# Patient Record
Sex: Female | Born: 1965 | Race: White | Hispanic: No | Smoking: Never smoker
Health system: Southern US, Community
[De-identification: ages and names within clinical notes are randomized; demographics above are authoritative.]

## PROBLEM LIST (undated history)

## (undated) DIAGNOSIS — K5900 Constipation, unspecified: Secondary | ICD-10-CM

## (undated) HISTORY — PX: JOINT REPLACEMENT: SHX530

---

## 2021-04-19 ENCOUNTER — Emergency Department (HOSPITAL_BASED_OUTPATIENT_CLINIC_OR_DEPARTMENT_OTHER)
Admission: EM | Admit: 2021-04-19 | Discharge: 2021-04-19 | Disposition: A | Discharge status: Home / Self Care | Payer: BC Managed Care – PPO | Attending: Emergency Medicine | Admitting: Emergency Medicine

## 2021-04-19 ENCOUNTER — Emergency Department (HOSPITAL_BASED_OUTPATIENT_CLINIC_OR_DEPARTMENT_OTHER): Payer: BC Managed Care – PPO

## 2021-04-19 ENCOUNTER — Encounter (HOSPITAL_BASED_OUTPATIENT_CLINIC_OR_DEPARTMENT_OTHER): Payer: Self-pay | Admitting: *Deleted

## 2021-04-19 ENCOUNTER — Other Ambulatory Visit: Payer: Self-pay

## 2021-04-19 DIAGNOSIS — R1033 Periumbilical pain: Secondary | ICD-10-CM

## 2021-04-19 DIAGNOSIS — N201 Calculus of ureter: Secondary | ICD-10-CM

## 2021-04-19 DIAGNOSIS — K439 Ventral hernia without obstruction or gangrene: Secondary | ICD-10-CM

## 2021-04-19 DIAGNOSIS — R109 Unspecified abdominal pain: Secondary | ICD-10-CM | POA: Diagnosis present

## 2021-04-19 DIAGNOSIS — K654 Sclerosing mesenteritis: Secondary | ICD-10-CM | POA: Diagnosis not present

## 2021-04-19 HISTORY — DX: Constipation, unspecified: K59.00

## 2021-04-19 LAB — CBC WITH DIFFERENTIAL/PLATELET
Abs Immature Granulocytes: 0.03 10*3/uL (ref 0.00–0.07)
Basophils Absolute: 0 10*3/uL (ref 0.0–0.1)
Basophils Relative: 0 %
Eosinophils Absolute: 0 10*3/uL (ref 0.0–0.5)
Eosinophils Relative: 0 %
HCT: 43.7 % (ref 36.0–46.0)
Hemoglobin: 14.5 g/dL (ref 12.0–15.0)
Immature Granulocytes: 0 %
Lymphocytes Relative: 6 %
Lymphs Abs: 0.8 10*3/uL (ref 0.7–4.0)
MCH: 29.9 pg (ref 26.0–34.0)
MCHC: 33.2 g/dL (ref 30.0–36.0)
MCV: 90.1 fL (ref 80.0–100.0)
Monocytes Absolute: 0.4 10*3/uL (ref 0.1–1.0)
Monocytes Relative: 3 %
Neutro Abs: 11.6 10*3/uL — ABNORMAL HIGH (ref 1.7–7.7)
Neutrophils Relative %: 91 %
Platelets: 313 10*3/uL (ref 150–400)
RBC: 4.85 MIL/uL (ref 3.87–5.11)
RDW: 12.4 % (ref 11.5–15.5)
WBC: 12.9 10*3/uL — ABNORMAL HIGH (ref 4.0–10.5)
nRBC: 0 % (ref 0.0–0.2)

## 2021-04-19 LAB — COMPREHENSIVE METABOLIC PANEL
ALT: 15 U/L (ref 0–44)
AST: 23 U/L (ref 15–41)
Albumin: 4.6 g/dL (ref 3.5–5.0)
Alkaline Phosphatase: 116 U/L (ref 38–126)
Anion gap: 8 (ref 5–15)
BUN: 15 mg/dL (ref 6–20)
CO2: 25 mmol/L (ref 22–32)
Calcium: 9.3 mg/dL (ref 8.9–10.3)
Chloride: 105 mmol/L (ref 98–111)
Creatinine, Ser: 0.79 mg/dL (ref 0.44–1.00)
GFR, Estimated: >60 mL/min (ref >60)
Glucose, Bld: 118 mg/dL — ABNORMAL HIGH (ref 70–99)
Potassium: 4.1 mmol/L (ref 3.5–5.1)
Sodium: 138 mmol/L (ref 135–145)
Total Bilirubin: 0.5 mg/dL (ref 0.3–1.2)
Total Protein: 7.9 g/dL (ref 6.5–8.1)

## 2021-04-19 LAB — URINALYSIS, ROUTINE W REFLEX MICROSCOPIC
Bilirubin Urine: NEGATIVE
Glucose, UA: NEGATIVE mg/dL
Ketones, ur: 40 mg/dL — AB
Leukocytes,Ua: NEGATIVE
Nitrite: NEGATIVE
Protein, ur: NEGATIVE mg/dL
Specific Gravity, Urine: 1.02 (ref 1.005–1.030)
pH: 8 (ref 5.0–8.0)

## 2021-04-19 LAB — URINALYSIS, MICROSCOPIC (REFLEX)

## 2021-04-19 LAB — PREGNANCY, URINE: Preg Test, Ur: NEGATIVE

## 2021-04-19 LAB — LIPASE, BLOOD: Lipase: 30 U/L (ref 11–51)

## 2021-04-19 MED ORDER — DICYCLOMINE HCL 20 MG PO TABS: 20.0000 mg | ORAL_TABLET | Freq: Two times a day (BID) | ORAL | 0 refills | Status: AC

## 2021-04-19 MED ORDER — OXYCODONE-ACETAMINOPHEN 5-325 MG PO TABS: 1.0000 | ORAL_TABLET | Freq: Four times a day (QID) | ORAL | 0 refills | Status: AC | PRN

## 2021-04-19 MED ORDER — SODIUM CHLORIDE 0.9 % IV BOLUS
1000.0000 mL | Freq: Once | INTRAVENOUS | Status: AC
  Administered 2021-04-19: 1000 mL via INTRAVENOUS

## 2021-04-19 MED ORDER — DIPHENHYDRAMINE HCL 50 MG/ML IJ SOLN
12.5000 mg | Freq: Once | INTRAMUSCULAR | Status: AC
  Administered 2021-04-19: 12.5 mg via INTRAVENOUS
  Filled 2021-04-19: qty 1

## 2021-04-19 MED ORDER — KETOROLAC TROMETHAMINE 30 MG/ML IJ SOLN
30.0000 mg | Freq: Once | INTRAMUSCULAR | Status: AC
  Administered 2021-04-19: 30 mg via INTRAVENOUS
  Filled 2021-04-19: qty 1

## 2021-04-19 MED ORDER — METOCLOPRAMIDE HCL 5 MG/ML IJ SOLN
10.0000 mg | Freq: Once | INTRAMUSCULAR | Status: AC
  Administered 2021-04-19: 10 mg via INTRAVENOUS
  Filled 2021-04-19: qty 2

## 2021-04-19 MED ORDER — PROMETHAZINE HCL 25 MG PO TABS: 25.0000 mg | ORAL_TABLET | Freq: Four times a day (QID) | ORAL | 0 refills | Status: AC | PRN

## 2021-04-19 MED ORDER — ONDANSETRON HCL 4 MG/2ML IJ SOLN
4.0000 mg | Freq: Once | INTRAMUSCULAR | Status: AC
  Administered 2021-04-19: 4 mg via INTRAVENOUS
  Filled 2021-04-19: qty 2

## 2021-04-19 MED ORDER — FENTANYL CITRATE (PF) 100 MCG/2ML IJ SOLN
50.0000 ug | INTRAMUSCULAR | Status: AC
  Administered 2021-04-19: 50 ug via INTRAVENOUS
  Filled 2021-04-19: qty 2

## 2021-04-19 MED ORDER — FAMOTIDINE IN NACL 20-0.9 MG/50ML-% IV SOLN
20.0000 mg | Freq: Once | INTRAVENOUS | Status: AC
  Administered 2021-04-19: 20 mg via INTRAVENOUS
  Filled 2021-04-19: qty 50

## 2021-04-19 NOTE — ED Triage Notes (Signed)
C/o abd pain n/d x 2 days hx constipation no BM x 3 days

## 2021-04-19 NOTE — ED Provider Notes (Signed)
[facility] EMERGENCY DEPARTMENT Provider Note   CSN: [id number] Arrival date & time: 04/19/21  1532     History Chief Complaint  Patient presents with  . Abdominal Pain    Amy Galvan is a 55 y.o. female.  HPI Patient is a 55 year old female with past medical history of IBS-C  Patient is presenting today with complaints of abdominal pain that seems to wrap across her entire mid abdomen she endorses nausea and some light diarrhea over the past couple days.  She states she had a bowel movement this morning.  But it was a very small amount and states that it was relatively hard in texture.  She denies any melena or hematochezia.  She states she has not had a "good "bowel movement in 3 days however.  She denies any vomiting since this morning when she had 1 episode of nonbloody nonbilious emesis states that she has felt nauseous the entire day.  She denies any chest pain or shortness of breath no lightheadedness or dizziness.  She has no history of abdominal surgeries apart from C-section approximately 20 years ago.  Past Medical History:  Diagnosis Date  . Constipated     There are no problems to display for this patient.   Past Surgical History:  Procedure Laterality Date  . CESAREAN SECTION       OB History   No obstetric history on file.     No family history on file.  Social History   Tobacco Use  . Smoking status: Never Smoker  . Smokeless tobacco: Never Used  Vaping Use  . Vaping Use: Never used  Substance Use Topics  . Alcohol use: Not Currently  . Drug use: Not Currently    Home Medications Prior to Admission medications   Medication Sig Start Date End Date Taking? Authorizing Provider  dicyclomine (BENTYL) 20 MG tablet Take 1 tablet (20 mg total) by mouth 2 (two) times daily. 04/19/21  Yes Jaimeson Gopal, Stevphen MeuseWylder S, PA  oxyCODONE-acetaminophen (PERCOCET/ROXICET) 5-325 MG tablet Take 1 tablet by mouth every 6 (six) hours as needed for severe  pain. 04/19/21  Yes Shakila Mak S, PA  promethazine (PHENERGAN) 25 MG tablet Take 1 tablet (25 mg total) by mouth every 6 (six) hours as needed for nausea or vomiting. 04/19/21  Yes Gailen ShelterFondaw, Jadiel Schmieder S, PA    Allergies    Patient has no known allergies.  Review of Systems   Review of Systems  Constitutional: Negative for chills and fever.  HENT: Negative for congestion.   Eyes: Negative for pain.  Respiratory: Negative for cough and shortness of breath.   Cardiovascular: Negative for chest pain and leg swelling.  Gastrointestinal: Positive for constipation and nausea. Negative for abdominal pain and vomiting.  Genitourinary: Negative for dysuria.  Musculoskeletal: Negative for myalgias.  Skin: Negative for rash.  Neurological: Negative for dizziness and headaches.    Physical Exam Updated Vital Signs BP 137/73   Pulse 77   Temp 98.6 F (37 C) (Oral)   Resp 18   Ht 5\' 6"  (1.676 m)   Wt 74.8 kg   SpO2 97%   BMI 26.63 kg/m   Physical Exam Vitals and nursing note reviewed.  Constitutional:      General: She is not in acute distress.    Comments: Uncomfortable appearing 55 year old female in no acute distress.  HENT:     Head: Normocephalic and atraumatic.     Nose: Nose normal.  Eyes:     General: No scleral  icterus. Cardiovascular:     Rate and Rhythm: Normal rate and regular rhythm.     Pulses: Normal pulses.     Heart sounds: Normal heart sounds.  Pulmonary:     Effort: Pulmonary effort is normal. No respiratory distress.     Breath sounds: Normal breath sounds. No wheezing.  Abdominal:     Palpations: Abdomen is soft.     Tenderness: There is abdominal tenderness.     Comments: Abdomen is soft, nonprotuberant abdomen.  Tenderness between epigastrium and umbilicus and seems to be diffuse nonfocal.  There are some left lower quadrant abdominal tenderness as well.  No guarding or rebound.  Musculoskeletal:     Cervical back: Normal range of motion.     Right lower  leg: No edema.     Left lower leg: No edema.  Skin:    General: Skin is warm and dry.     Capillary Refill: Capillary refill takes less than 2 seconds.  Neurological:     Mental Status: She is alert. Mental status is at baseline.  Psychiatric:        Mood and Affect: Mood normal.        Behavior: Behavior normal.     ED Results / Procedures / Treatments   Labs (all labs ordered are listed, but only abnormal results are displayed) Labs Reviewed  COMPREHENSIVE METABOLIC PANEL - Abnormal; Notable for the following components:      Result Value   Glucose, Bld 118 (*)    All other components within normal limits  CBC WITH DIFFERENTIAL/PLATELET - Abnormal; Notable for the following components:   WBC 12.9 (*)    Neutro Abs 11.6 (*)    All other components within normal limits  URINALYSIS, ROUTINE W REFLEX MICROSCOPIC - Abnormal; Notable for the following components:   Hgb urine dipstick MODERATE (*)    Ketones, ur 40 (*)    All other components within normal limits  URINALYSIS, MICROSCOPIC (REFLEX) - Abnormal; Notable for the following components:   Bacteria, UA FEW (*)    All other components within normal limits  LIPASE, BLOOD  PREGNANCY, URINE    EKG None  Radiology CT ABDOMEN PELVIS WO CONTRAST  Result Date: 04/19/2021 CLINICAL DATA:  Epigastric pain. EXAM: CT ABDOMEN AND PELVIS WITHOUT CONTRAST TECHNIQUE: Multidetector CT imaging of the abdomen and pelvis was performed following the standard protocol without IV contrast. COMPARISON:  None. FINDINGS: Lower chest: No acute abnormality. Hepatobiliary: No focal liver abnormality is seen. No gallstones, gallbladder wall thickening, or biliary dilatation. Pancreas: Unremarkable. No pancreatic ductal dilatation or surrounding inflammatory changes. Spleen: Normal in size without focal abnormality. Adrenals/Urinary Tract: Adrenal glands are unremarkable. Kidneys are normal in size without focal lesions. 2 mm nonobstructing renal stones  are seen within the right kidney. A 3 mm calcification is seen along the expected region of the left UVJ, with mild to moderate severity left-sided hydronephrosis, hydroureter and perinephric inflammatory fat stranding. Bladder is unremarkable. Stomach/Bowel: There is a small hiatal hernia. Appendix appears normal. Numerous foci of retained barium are seen within the large bowel. No evidence of bowel wall thickening, distention, or inflammatory changes. Vascular/Lymphatic: No significant vascular findings are present. No enlarged abdominal or pelvic lymph nodes. Reproductive: An IUD is seen within the uterus. The bilateral adnexa are unremarkable. Other: A 3.8 cm x 2.8 cm fat containing ventral hernia is seen along the midline of the mid to upper abdominal wall. A mild amount of associated inflammatory fat stranding is seen.  No abdominopelvic ascites. Musculoskeletal: No acute or significant osseous findings. IMPRESSION: 1. 3 mm renal stone at the left UVJ. 2. Subcentimeter nonobstructing renal stones within the right kidney. 3. Small hiatal hernia. 4. Fat containing ventral hernia with a mild amount of associated inflammation. Sequelae associated with incarcerated hernia cannot be excluded. Electronically Signed   By: Aram Candela M.D.   On: 04/19/2021 18:49    Procedures Procedures   Medications Ordered in ED Medications  famotidine (PEPCID) IVPB 20 mg premix (0 mg Intravenous Stopped 04/19/21 1804)  metoCLOPramide (REGLAN) injection 10 mg (10 mg Intravenous Given 04/19/21 1732)  diphenhydrAMINE (BENADRYL) injection 12.5 mg (12.5 mg Intravenous Given 04/19/21 1730)  sodium chloride 0.9 % bolus 1,000 mL ( Intravenous Stopped 04/19/21 1843)  fentaNYL (SUBLIMAZE) injection 50 mcg (50 mcg Intravenous Given 04/19/21 1742)  ondansetron (ZOFRAN) injection 4 mg (4 mg Intravenous Given 04/19/21 1838)  ketorolac (TORADOL) 30 MG/ML injection 30 mg (30 mg Intravenous Given 04/19/21 1952)  fentaNYL (SUBLIMAZE) injection  50 mcg (50 mcg Intravenous Given 04/19/21 1955)    ED Course  I have reviewed the triage vital signs and the nursing notes.  Pertinent labs & imaging results that were available during my care of the patient were reviewed by me and considered in my medical decision making (see chart for details).    MDM Rules/Calculators/A&P                          Patient with past medical history significant for IBS-C here with abdominal pain has been ongoing for 2 days has had some constipation but also some episodes of unsatisfactory small amounts of diarrhea.  Physical exam is notable for some periumbilical/ventral tenderness to palpation she also has some left lower quadrant tender to palpation.  She has some hematuria no history of kidney stones per patient.  Seems to have somewhat colicky pain however.  Given her abnormal bowel movements some concern for diverticulitis but also is complaining of some epigastric pain which raises a concern for ulcer/perforated ulcer/gastritis.  Lipase within normal limits doubt pancreatitis and her history is not consistent with the most common causes of pancreatitis with no significant alcohol or history of biliary stones.  Urinalysis unremarkable apart from moderate hematuria.  I do have some suspicion for kidney stone we will therefore obtain CT abdomen pelvis without contrast.  CMP unremarkable.  CBC with mild leukocytosis of 12.9 likely due to demarginalization from vomiting.  On reassessment after  fentanyl, Pepcid, Reglan and Benadryl patient is feeling significantly improved.  However approximately 1 hour later I was informed by bedside RN that patient is feeling nauseous again.  Given 1 dose of Zofran.  CT scan shows the patient has a 3 mm stone at the left UVJ and also has a fat-containing hernia.  No bowel incarceration.  I discussed this case with my attending physician who cosigned this note including patient's presenting symptoms, physical exam, and  planned diagnostics and interventions. Attending physician stated agreement with plan or made changes to plan which were implemented.   Patient informed of these findings.  Ultimately fat-containing hernia will require NSAIDs, cool compresses and follow-up with general surgery.  Will prescribe patient Phenergan, Bentyl, recommend Tylenol ibuprofen and cool compresses and given 4 tablets of Percocet for breakthrough pain.  She will follow-up with general surgery and PCP.  Return precautions were given.  Patient is agreeable to plan.    Final Clinical Impression(s) / ED Diagnoses Final  diagnoses:  Periumbilical abdominal pain  Fatty hernia of linea alba  Ureterolithiasis    Rx / DC Orders ED Discharge Orders         Ordered    promethazine (PHENERGAN) 25 MG tablet  Every 6 hours PRN        04/19/21 1939    oxyCODONE-acetaminophen (PERCOCET/ROXICET) 5-325 MG tablet  Every 6 hours PRN        04/19/21 1939    dicyclomine (BENTYL) 20 MG tablet  2 times daily        04/19/21 2030           [place], [place] 04/19/21 2038    Pricilla Loveless, MD 04/20/21 1601

## 2021-04-19 NOTE — Discharge Instructions (Signed)
Your CT scan is notable for a kidney stone which is on the left side just about the area where your ureter empties into your bladder.  There is also a fat-containing hernia which is likely causing your symptoms.  I have given you the information for [facility] surgery.  You may follow-up with them or any general surgeon you are familiar with or established with.  Please use Tylenol or ibuprofen for pain.  You may use 600 mg ibuprofen every 6 hours or 1000 mg of Tylenol every 6 hours.  You may choose to alternate between the 2.  This would be most effective.  Not to exceed 4 g of Tylenol within 24 hours.  Not to exceed 3200 mg ibuprofen 24 hours.   I have also given you 2 tablets of Percocet to use for breakthrough pain.  Please drink plenty of water.  I prescribed you Phenergan for nausea you may use this is an oral medicine if your nausea becomes vomiting and you are unable to keep Phenergan down you may use it as a vaginal medicine you may place it in your vagina and will dissolve and into your bloodstream in a similar fashion to take the medicine orally.

## 2022-01-03 DIAGNOSIS — M25552 Pain in left hip: Secondary | ICD-10-CM | POA: Diagnosis not present

## 2022-01-03 DIAGNOSIS — K589 Irritable bowel syndrome without diarrhea: Secondary | ICD-10-CM | POA: Diagnosis not present

## 2022-01-03 DIAGNOSIS — M25562 Pain in left knee: Secondary | ICD-10-CM | POA: Diagnosis not present

## 2022-01-03 DIAGNOSIS — F419 Anxiety disorder, unspecified: Secondary | ICD-10-CM | POA: Diagnosis not present

## 2022-01-24 ENCOUNTER — Ambulatory Visit (HOSPITAL_BASED_OUTPATIENT_CLINIC_OR_DEPARTMENT_OTHER)
Admission: RE | Admit: 2022-01-24 | Discharge: 2022-01-24 | Disposition: A | Discharge status: Home / Self Care | Payer: BC Managed Care – PPO | Source: Ambulatory Visit | Attending: Family Medicine | Admitting: Family Medicine

## 2022-01-24 ENCOUNTER — Other Ambulatory Visit: Payer: Self-pay

## 2022-01-24 ENCOUNTER — Encounter: Payer: Self-pay | Admitting: Family Medicine

## 2022-01-24 ENCOUNTER — Ambulatory Visit (INDEPENDENT_AMBULATORY_CARE_PROVIDER_SITE_OTHER): Payer: BC Managed Care – PPO | Admitting: Family Medicine

## 2022-01-24 ENCOUNTER — Ambulatory Visit: Payer: Self-pay

## 2022-01-24 VITALS — BP 112/90 | Ht 66.0 in | Wt 156.0 lb

## 2022-01-24 DIAGNOSIS — M87052 Idiopathic aseptic necrosis of left femur: Secondary | ICD-10-CM | POA: Diagnosis not present

## 2022-01-24 DIAGNOSIS — M25552 Pain in left hip: Secondary | ICD-10-CM

## 2022-01-24 MED ORDER — MELOXICAM 15 MG PO TABS: ORAL_TABLET | ORAL | 1 refills | Status: AC

## 2022-01-24 NOTE — Assessment & Plan Note (Signed)
Acute on chronic in nature.  Has significant changes within the hip joint that is the reason for her limited range of motion. ?-Counseled on home exercise therapy and supportive care. ?-X-ray. ?-Mobic ?-Could consider injection or physical therapy ?

## 2022-01-24 NOTE — Progress Notes (Signed)
?  Amy Galvan - 56 y.o. female MRN [id number]  Date of birth: [birth year] ? ?SUBJECTIVE:  Including CC & ROS.  ?No chief complaint on file. ? ? ?Amy Galvan is a 56 y.o. female that is presenting with acute on chronic left hip pain.  She has limited range of motion in flexion as well as internal and external rotation.  She reports having an injury a few years ago.  Has been taking ibuprofen and Tylenol to help with the pain. ? ?Review of the note from 2/21 shows she was having hip and knee pain. ? ? ?Review of Systems ?See HPI  ? ?HISTORY: Past Medical, Surgical, Social, and Family History Reviewed & Updated per EMR.   ?Pertinent Historical Findings include: ? ?Past Medical History:  ?Diagnosis Date  ? Constipated   ? ? ?Past Surgical History:  ?Procedure Laterality Date  ? CESAREAN SECTION    ? ? ? ?PHYSICAL EXAM:  ?VS: BP 112/90 (BP Location: Right Arm, Patient Position: Sitting)   Ht 5\' 6"  (1.676 m)   Wt 156 lb (70.8 kg)   BMI 25.18 kg/m?  ?Physical Exam ?Gen: NAD, alert, cooperative with exam, well-appearing ?MSK:  ?Neurovascularly intact   ? ?Limited ultrasound: Left hip: ? ?Effusion and significant deformity appreciated of the femoral head within the acetabulum.  Dynamic testing was revealing for limited range of motion. ? ?Summary: Findings suggestive of avascular necrosis of the femoral head. ? ?Ultrasound and interpretation by , MD ? ? ? ?ASSESSMENT & PLAN:  ? ?Avascular necrosis of bone of hip, left (HCC) ?Acute on chronic in nature.  Has significant changes within the hip joint that is the reason for her limited range of motion. ?-Counseled on home exercise therapy and supportive care. ?-X-ray. ?-Mobic ?-Could consider injection or physical therapy ? ? ? ? ?

## 2022-01-24 NOTE — Patient Instructions (Signed)
Nice to meet you ?Please try the exercises  ?I will call with the xray results.   ?Please send me a message in MyChart with any questions or updates.  ?Please see me back if you want to try an injection.  ? ?--Dr. Jordan Likes ? ?

## 2022-01-26 ENCOUNTER — Telehealth: Payer: Self-pay | Admitting: Family Medicine

## 2022-01-26 NOTE — Telephone Encounter (Signed)
Informed of results. Referral placed to ortho for THA.  ? ?Rosemarie Ax, MD ?[facility] ?01/26/2022, 8:34 AM ? ?

## 2022-02-07 DIAGNOSIS — M1612 Unilateral primary osteoarthritis, left hip: Secondary | ICD-10-CM | POA: Diagnosis not present

## 2022-03-07 DIAGNOSIS — Z0000 Encounter for general adult medical examination without abnormal findings: Secondary | ICD-10-CM | POA: Diagnosis not present

## 2022-03-07 DIAGNOSIS — R7989 Other specified abnormal findings of blood chemistry: Secondary | ICD-10-CM | POA: Diagnosis not present

## 2022-03-07 DIAGNOSIS — Z01419 Encounter for gynecological examination (general) (routine) without abnormal findings: Secondary | ICD-10-CM | POA: Diagnosis not present

## 2022-03-07 DIAGNOSIS — Z13228 Encounter for screening for other metabolic disorders: Secondary | ICD-10-CM | POA: Diagnosis not present

## 2022-03-07 DIAGNOSIS — Z124 Encounter for screening for malignant neoplasm of cervix: Secondary | ICD-10-CM | POA: Diagnosis not present

## 2022-03-07 DIAGNOSIS — Z13220 Encounter for screening for lipoid disorders: Secondary | ICD-10-CM | POA: Diagnosis not present

## 2022-03-07 DIAGNOSIS — R739 Hyperglycemia, unspecified: Secondary | ICD-10-CM | POA: Diagnosis not present

## 2022-03-23 DIAGNOSIS — M1612 Unilateral primary osteoarthritis, left hip: Secondary | ICD-10-CM | POA: Diagnosis not present

## 2022-03-30 DIAGNOSIS — M1612 Unilateral primary osteoarthritis, left hip: Secondary | ICD-10-CM | POA: Diagnosis not present

## 2022-03-31 DIAGNOSIS — M1612 Unilateral primary osteoarthritis, left hip: Secondary | ICD-10-CM | POA: Diagnosis not present

## 2022-07-14 IMAGING — CT CT ABD-PELV W/O CM
2 of 4 series · 16 of 46 positions shown, 18 images · non-contrast
Comparison: None.

CLINICAL DATA: Epigastric pain.

EXAM:
CT ABDOMEN AND PELVIS WITHOUT CONTRAST
TECHNIQUE: Multidetector CT imaging of the abdomen and pelvis was performed
following the standard protocol without IV contrast.

[Series 2: axial st · axial · 0.91mm/px · z∈[+857,+1267]mm · 13 of 90 slices shown, 15 images]
[im 4/90  soft-tissue]
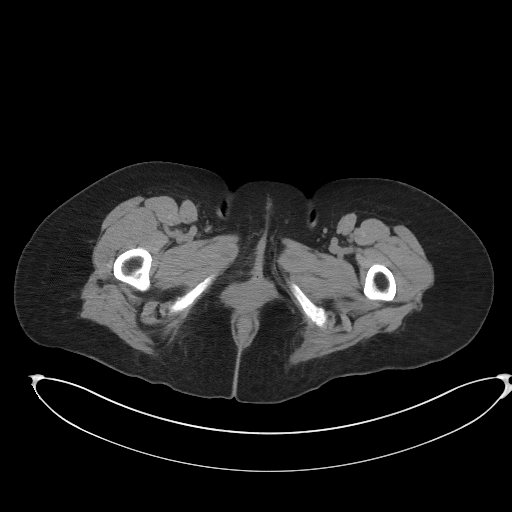
[im 4/90  bone]
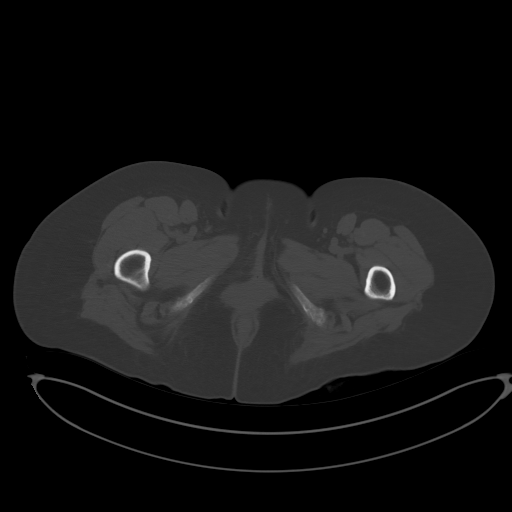
[im 12/90  soft-tissue]
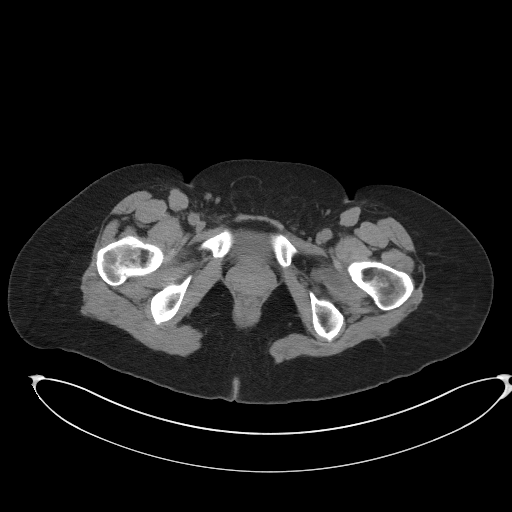
[im 20/90  soft-tissue]
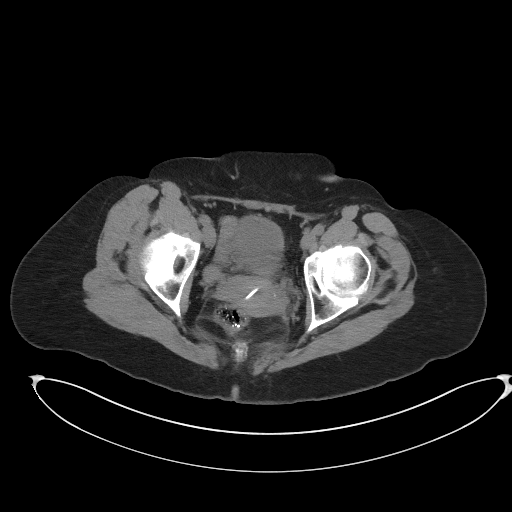
[im 24/90  soft-tissue]
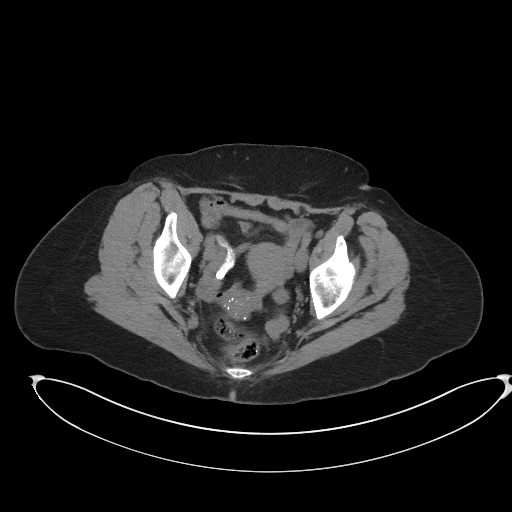
[im 31/90  soft-tissue]
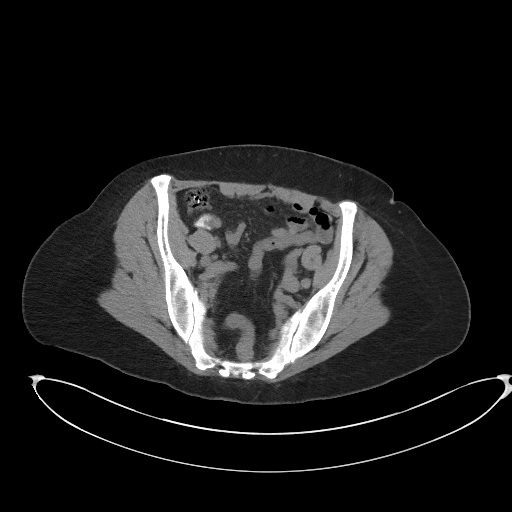
[im 39/90  soft-tissue]
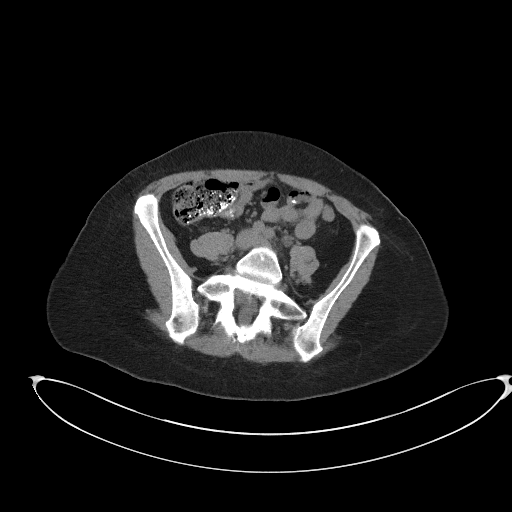
[im 47/90  soft-tissue]
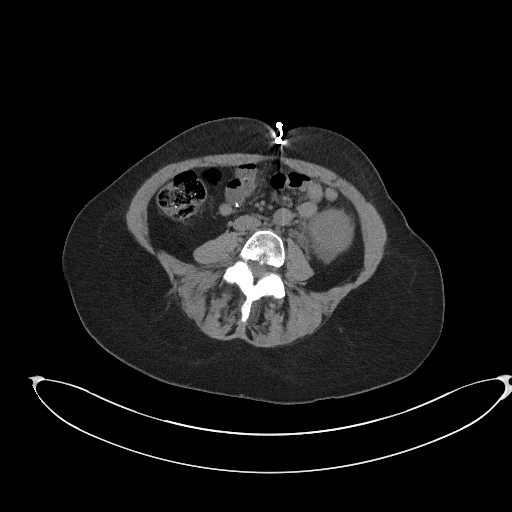
[im 51/90  soft-tissue]
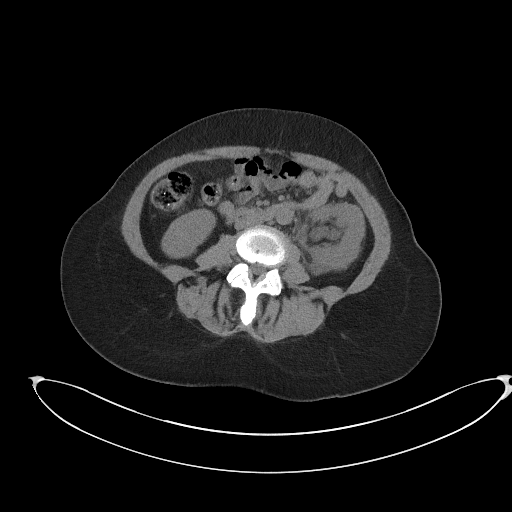
[im 59/90  soft-tissue]
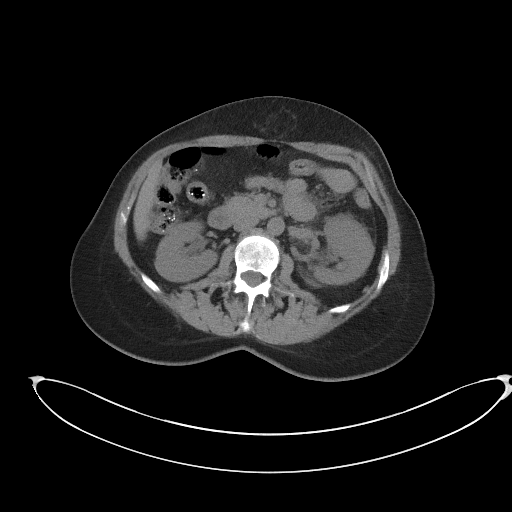
[im 59/90  bone]
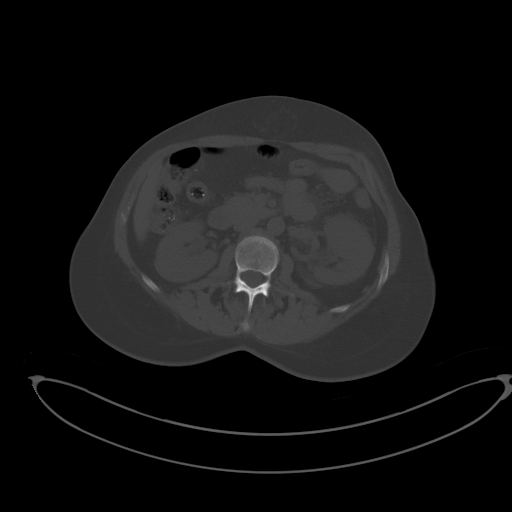
[im 66/90  soft-tissue]
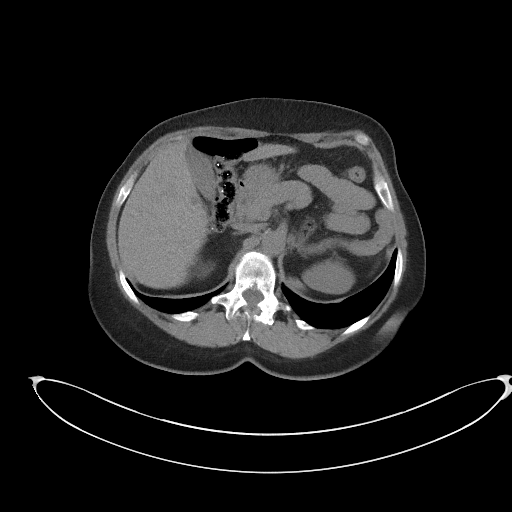
[im 70/90  soft-tissue]
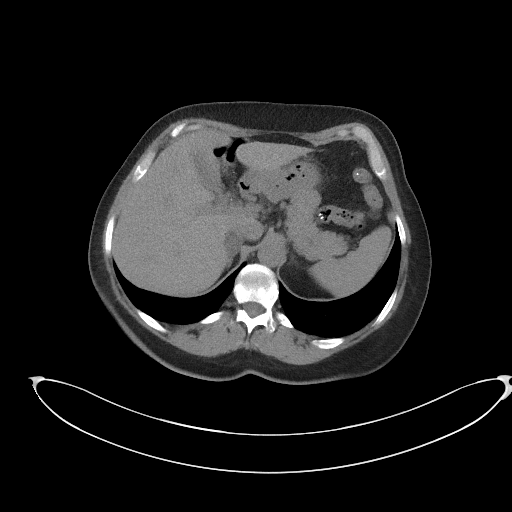
[im 78/90  soft-tissue]
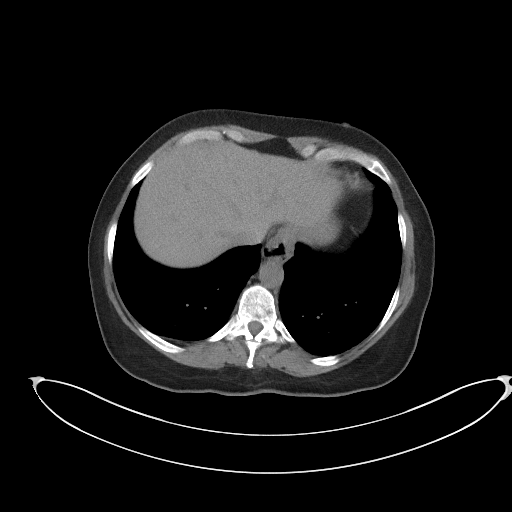
[im 86/90  soft-tissue]
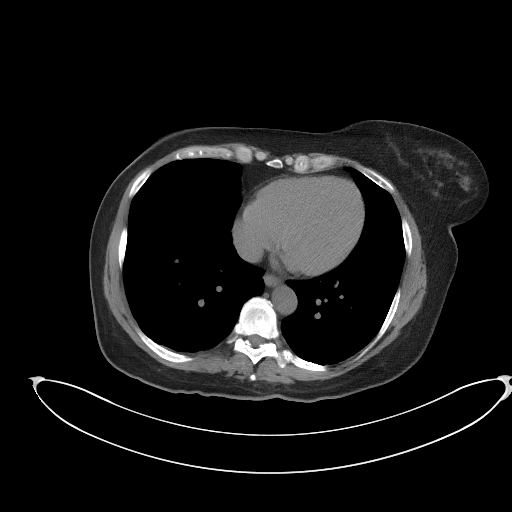

[Series 5: coronal st · coronal · 0.76mm/px · 3 of 108 slices shown]
[im 36/108  soft-tissue]
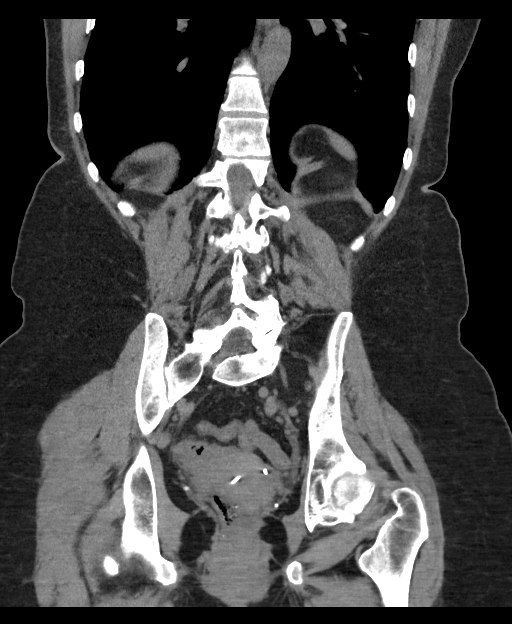
[im 48/108  soft-tissue]
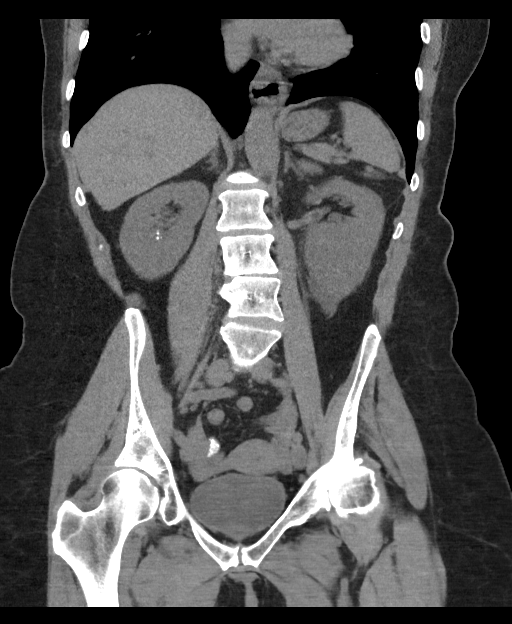
[im 60/108  soft-tissue]
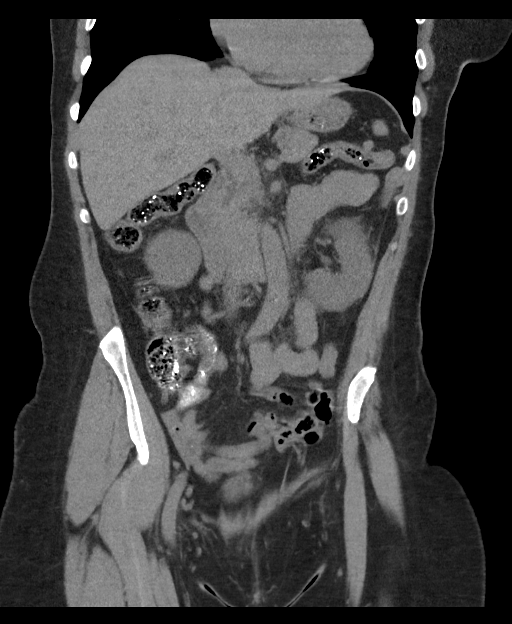

[16 of 46 positions shown; findings below may reference images not displayed]

FINDINGS: Lower chest: No acute abnormality.

Hepatobiliary: No focal liver abnormality is seen. No gallstones,
gallbladder wall thickening, or biliary dilatation.

Pancreas: Unremarkable. No pancreatic ductal dilatation or
surrounding inflammatory changes.

Spleen: Normal in size without focal abnormality.

Adrenals/Urinary Tract: Adrenal glands are unremarkable. Kidneys are
normal in size without focal lesions. 2 mm nonobstructing renal
stones are seen within the right kidney. A 3 mm calcification is
seen along the expected region of the left UVJ, with mild to
moderate severity left-sided hydronephrosis, hydroureter and
perinephric inflammatory fat stranding. Bladder is unremarkable.

Stomach/Bowel: There is a small hiatal hernia. Appendix appears
normal. Numerous foci of retained barium are seen within the large
bowel. No evidence of bowel wall thickening, distention, or
inflammatory changes.

Vascular/Lymphatic: No significant vascular findings are present. No
enlarged abdominal or pelvic lymph nodes.

Reproductive: An IUD is seen within the uterus. The bilateral adnexa
are unremarkable.

Other: A 3.8 cm x 2.8 cm fat containing ventral hernia is seen along
the midline of the mid to upper abdominal wall. A mild amount of
associated inflammatory fat stranding is seen. No abdominopelvic
ascites.

Musculoskeletal: No acute or significant osseous findings.
IMPRESSION: 1. 3 mm renal stone at the left UVJ.
2. Subcentimeter nonobstructing renal stones within the right
kidney.
3. Small hiatal hernia.
4. Fat containing ventral hernia with a mild amount of associated
inflammation. Sequelae associated with incarcerated hernia cannot be
excluded.

## 2023-03-01 ENCOUNTER — Encounter: Payer: Self-pay | Admitting: *Deleted

## 2023-04-25 DIAGNOSIS — F419 Anxiety disorder, unspecified: Secondary | ICD-10-CM | POA: Diagnosis not present

## 2023-04-25 DIAGNOSIS — K589 Irritable bowel syndrome without diarrhea: Secondary | ICD-10-CM | POA: Diagnosis not present

## 2023-07-03 DIAGNOSIS — H6121 Impacted cerumen, right ear: Secondary | ICD-10-CM | POA: Diagnosis not present

## 2023-07-03 DIAGNOSIS — Z23 Encounter for immunization: Secondary | ICD-10-CM | POA: Diagnosis not present

## 2023-07-03 DIAGNOSIS — Z0000 Encounter for general adult medical examination without abnormal findings: Secondary | ICD-10-CM | POA: Diagnosis not present

## 2023-07-09 ENCOUNTER — Emergency Department (HOSPITAL_BASED_OUTPATIENT_CLINIC_OR_DEPARTMENT_OTHER)
Admission: EM | Admit: 2023-07-09 | Discharge: 2023-07-09 | Disposition: A | Discharge status: Home / Self Care | Payer: BC Managed Care – PPO | Attending: Emergency Medicine | Admitting: Emergency Medicine

## 2023-07-09 ENCOUNTER — Other Ambulatory Visit: Payer: Self-pay

## 2023-07-09 ENCOUNTER — Encounter (HOSPITAL_BASED_OUTPATIENT_CLINIC_OR_DEPARTMENT_OTHER): Payer: Self-pay | Admitting: Emergency Medicine

## 2023-07-09 ENCOUNTER — Emergency Department (HOSPITAL_BASED_OUTPATIENT_CLINIC_OR_DEPARTMENT_OTHER): Payer: BC Managed Care – PPO

## 2023-07-09 DIAGNOSIS — R197 Diarrhea, unspecified: Secondary | ICD-10-CM | POA: Insufficient documentation

## 2023-07-09 DIAGNOSIS — K439 Ventral hernia without obstruction or gangrene: Secondary | ICD-10-CM | POA: Diagnosis not present

## 2023-07-09 DIAGNOSIS — R112 Nausea with vomiting, unspecified: Secondary | ICD-10-CM | POA: Diagnosis not present

## 2023-07-09 DIAGNOSIS — R1013 Epigastric pain: Secondary | ICD-10-CM | POA: Diagnosis not present

## 2023-07-09 DIAGNOSIS — R109 Unspecified abdominal pain: Secondary | ICD-10-CM | POA: Diagnosis not present

## 2023-07-09 LAB — URINALYSIS, ROUTINE W REFLEX MICROSCOPIC
Bilirubin Urine: NEGATIVE
Glucose, UA: NEGATIVE mg/dL
Ketones, ur: 15 mg/dL — AB
Leukocytes,Ua: NEGATIVE
Nitrite: NEGATIVE
Protein, ur: NEGATIVE mg/dL
Specific Gravity, Urine: >=1.03 (ref 1.005–1.030)
pH: 5.5 (ref 5.0–8.0)

## 2023-07-09 LAB — COMPREHENSIVE METABOLIC PANEL
ALT: 28 U/L (ref 0–44)
AST: 55 U/L — ABNORMAL HIGH (ref 15–41)
Albumin: 4.2 g/dL (ref 3.5–5.0)
Alkaline Phosphatase: 108 U/L (ref 38–126)
Anion gap: 9 (ref 5–15)
BUN: 17 mg/dL (ref 6–20)
CO2: 23 mmol/L (ref 22–32)
Calcium: 8.6 mg/dL — ABNORMAL LOW (ref 8.9–10.3)
Chloride: 107 mmol/L (ref 98–111)
Creatinine, Ser: 0.6 mg/dL (ref 0.44–1.00)
GFR, Estimated: >60 mL/min (ref >60)
Glucose, Bld: 107 mg/dL — ABNORMAL HIGH (ref 70–99)
Potassium: 3.5 mmol/L (ref 3.5–5.1)
Sodium: 139 mmol/L (ref 135–145)
Total Bilirubin: 0.4 mg/dL (ref 0.3–1.2)
Total Protein: 7.7 g/dL (ref 6.5–8.1)

## 2023-07-09 LAB — CBC WITH DIFFERENTIAL/PLATELET
Abs Immature Granulocytes: 0.05 10*3/uL (ref 0.00–0.07)
Basophils Absolute: 0 10*3/uL (ref 0.0–0.1)
Basophils Relative: 0 %
Eosinophils Absolute: 0.2 10*3/uL (ref 0.0–0.5)
Eosinophils Relative: 2 %
HCT: 41.4 % (ref 36.0–46.0)
Hemoglobin: 13.6 g/dL (ref 12.0–15.0)
Immature Granulocytes: 0 %
Lymphocytes Relative: 9 %
Lymphs Abs: 1 10*3/uL (ref 0.7–4.0)
MCH: 29.6 pg (ref 26.0–34.0)
MCHC: 32.9 g/dL (ref 30.0–36.0)
MCV: 90.2 fL (ref 80.0–100.0)
Monocytes Absolute: 0.7 10*3/uL (ref 0.1–1.0)
Monocytes Relative: 6 %
Neutro Abs: 9.7 10*3/uL — ABNORMAL HIGH (ref 1.7–7.7)
Neutrophils Relative %: 83 %
Platelets: 231 10*3/uL (ref 150–400)
RBC: 4.59 MIL/uL (ref 3.87–5.11)
RDW: 12.2 % (ref 11.5–15.5)
WBC: 11.8 10*3/uL — ABNORMAL HIGH (ref 4.0–10.5)
nRBC: 0 % (ref 0.0–0.2)

## 2023-07-09 LAB — URINALYSIS, MICROSCOPIC (REFLEX)

## 2023-07-09 LAB — LIPASE, BLOOD: Lipase: 37 U/L (ref 11–51)

## 2023-07-09 LAB — PREGNANCY, URINE: Preg Test, Ur: NEGATIVE

## 2023-07-09 MED ORDER — IOHEXOL 300 MG/ML  SOLN
100.0000 mL | Freq: Once | INTRAMUSCULAR | Status: AC | PRN
  Administered 2023-07-09: 100 mL via INTRAVENOUS

## 2023-07-09 MED ORDER — ONDANSETRON 8 MG PO TBDP: ORAL_TABLET | ORAL | 0 refills | Status: DC

## 2023-07-09 MED ORDER — KETOROLAC TROMETHAMINE 30 MG/ML IJ SOLN
30.0000 mg | Freq: Once | INTRAMUSCULAR | Status: AC
  Administered 2023-07-09: 30 mg via INTRAVENOUS
  Filled 2023-07-09: qty 1

## 2023-07-09 MED ORDER — SODIUM CHLORIDE 0.9 % IV BOLUS
1000.0000 mL | Freq: Once | INTRAVENOUS | Status: AC
  Administered 2023-07-09: 1000 mL via INTRAVENOUS

## 2023-07-09 MED ORDER — ONDANSETRON 8 MG PO TBDP: ORAL_TABLET | ORAL | 0 refills | Status: AC

## 2023-07-09 MED ORDER — ONDANSETRON HCL 4 MG/2ML IJ SOLN
4.0000 mg | Freq: Once | INTRAMUSCULAR | Status: AC
  Administered 2023-07-09: 4 mg via INTRAVENOUS
  Filled 2023-07-09: qty 2

## 2023-07-09 MED ORDER — MORPHINE SULFATE (PF) 4 MG/ML IV SOLN
4.0000 mg | Freq: Once | INTRAVENOUS | Status: AC
  Administered 2023-07-09: 4 mg via INTRAVENOUS
  Filled 2023-07-09: qty 1

## 2023-07-09 NOTE — ED Provider Notes (Signed)
[place] EMERGENCY DEPARTMENT AT [facility] Provider Note   CSN: [id number] Arrival date & time: 07/09/23  0135     History  Chief Complaint  Patient presents with   Emesis    Amy Galvan is a 57 y.o. female.  Patient is a 57 year old female with history of irritable bowel syndrome.  Patient presenting today with complaints of abdominal pain, nausea, vomiting, and diarrhea.  The symptoms started suddenly this evening.  She states that her doctor made some changes in her medications recently.  She denies any fevers or chills.  She denies any ill contacts or having consumed any undercooked or suspicious foods.  The history is provided by the patient.       Home Medications Prior to Admission medications   Medication Sig Start Date End Date Taking? Authorizing Provider  linaclotide (LINZESS) 145 MCG CAPS capsule Take 145 mcg by mouth as directed. One on Saturdays, one on Sundays   Yes [provider]  dicyclomine (BENTYL) 20 MG tablet Take 1 tablet (20 mg total) by mouth 2 (two) times daily. 04/19/21   Gailen Shelter, PA  meloxicam (MOBIC) 15 MG tablet One tab PO qAM with breakfast for 2 weeks, then daily prn pain. 01/24/22   Myra Rude, MD  oxyCODONE-acetaminophen (PERCOCET/ROXICET) 5-325 MG tablet Take 1 tablet by mouth every 6 (six) hours as needed for severe pain. 04/19/21   Gailen Shelter, PA  promethazine (PHENERGAN) 25 MG tablet Take 1 tablet (25 mg total) by mouth every 6 (six) hours as needed for nausea or vomiting. 04/19/21   Gailen Shelter, PA      Allergies    Patient has no known allergies.    Review of Systems   Review of Systems  All other systems reviewed and are negative.   Physical Exam Updated Vital Signs BP (!) 153/84 (BP Location: Right Arm)   Pulse 71   Temp 97.8 F (36.6 C) (Oral)   Resp 20   Ht 5\' 6"  (1.676 m)   Wt 71.2 kg   SpO2 97%   BMI 25.34 kg/m  Physical Exam Vitals and nursing note reviewed.   Constitutional:      General: She is not in acute distress.    Appearance: She is well-developed. She is not diaphoretic.  HENT:     Head: Normocephalic and atraumatic.  Cardiovascular:     Rate and Rhythm: Normal rate and regular rhythm.     Heart sounds: No murmur heard.    No friction rub. No gallop.  Pulmonary:     Effort: Pulmonary effort is normal. No respiratory distress.     Breath sounds: Normal breath sounds. No wheezing.  Abdominal:     General: Bowel sounds are normal. There is no distension.     Palpations: Abdomen is soft.     Tenderness: There is abdominal tenderness. There is no right CVA tenderness, left CVA tenderness, guarding or rebound.     Comments: There is tenderness to palpation in the epigastric region.  Musculoskeletal:        General: Normal range of motion.     Cervical back: Normal range of motion and neck supple.  Skin:    General: Skin is warm and dry.  Neurological:     General: No focal deficit present.     Mental Status: She is alert and oriented to person, place, and time.     ED Results / Procedures / Treatments   Labs (all labs  ordered are listed, but only abnormal results are displayed) Labs Reviewed  LIPASE, BLOOD  COMPREHENSIVE METABOLIC PANEL  URINALYSIS, ROUTINE W REFLEX MICROSCOPIC  PREGNANCY, URINE  CBC WITH DIFFERENTIAL/PLATELET    EKG None  Radiology No results found.  Procedures Procedures    Medications Ordered in ED Medications  sodium chloride 0.9 % bolus 1,000 mL (has no administration in time range)  ondansetron (ZOFRAN) injection 4 mg (has no administration in time range)  ketorolac (TORADOL) 30 MG/ML injection 30 mg (has no administration in time range)  morphine (PF) 4 MG/ML injection 4 mg (has no administration in time range)    ED Course/ Medical Decision Making/ A&P  Patient is a 57 year old female with history of irritable bowel presenting with abdominal pain, nausea, vomiting, and diarrhea as  described in the HPI.  She arrives here with stable vital signs and is afebrile.  Physical examination basically unremarkable with the exception of tenderness in the epigastric region.  Workup initiated including CBC, CMP, and lipase.  She has a white count of 11.8, but studies are otherwise unremarkable.  Urinalysis is clear and pregnancy test is negative.  CT scan of the abdomen and pelvis obtained showing no acute finding.  Patient has been hydrated with normal saline.  She has also been given Toradol and morphine for pain along with Zofran for nausea.  She seems to be feeling better and is tolerating p.o.  At this point, nothing seems surgical or emergent.  I feel as though patient can safely be discharged with Zofran and follow-up with her gastroenterologist.  She tells me that she did recently have some medication changes and she may need to discuss this with her GI doctor.  Final Clinical Impression(s) / ED Diagnoses Final diagnoses:  None    Rx / DC Orders ED Discharge Orders     None         Geoffery Lyons, MD 07/09/23 [phone]

## 2023-07-09 NOTE — Discharge Instructions (Signed)
Begin taking Zofran as prescribed as needed for nausea.  Follow-up with your gastroenterologist in the next few days if not improving.

## 2023-07-09 NOTE — ED Triage Notes (Signed)
Pt states diarrhea x8 times that started after last night, and then vomiting x4. Pt reports she may have had blood in the vomit, but denies any blood in stools. She reports personal hx of IBS-C with medication changes that went into effect this weekend. Denies fever. Also reports lower back pain (midline). NAD

## 2023-07-17 ENCOUNTER — Other Ambulatory Visit: Payer: Self-pay | Admitting: Family Medicine

## 2023-07-17 DIAGNOSIS — Z1231 Encounter for screening mammogram for malignant neoplasm of breast: Secondary | ICD-10-CM

## 2024-07-31 DIAGNOSIS — F411 Generalized anxiety disorder: Secondary | ICD-10-CM | POA: Diagnosis not present

## 2024-07-31 DIAGNOSIS — K581 Irritable bowel syndrome with constipation: Secondary | ICD-10-CM | POA: Diagnosis not present

## 2024-07-31 DIAGNOSIS — Z23 Encounter for immunization: Secondary | ICD-10-CM | POA: Diagnosis not present

## 2024-07-31 DIAGNOSIS — Z13220 Encounter for screening for lipoid disorders: Secondary | ICD-10-CM | POA: Diagnosis not present

## 2024-07-31 DIAGNOSIS — Z131 Encounter for screening for diabetes mellitus: Secondary | ICD-10-CM | POA: Diagnosis not present

## 2024-07-31 DIAGNOSIS — Z0000 Encounter for general adult medical examination without abnormal findings: Secondary | ICD-10-CM | POA: Diagnosis not present

## 2024-09-19 DIAGNOSIS — Z03818 Encounter for observation for suspected exposure to other biological agents ruled out: Secondary | ICD-10-CM | POA: Diagnosis not present

## 2024-09-19 DIAGNOSIS — R519 Headache, unspecified: Secondary | ICD-10-CM | POA: Diagnosis not present

## 2024-09-19 DIAGNOSIS — B349 Viral infection, unspecified: Secondary | ICD-10-CM | POA: Diagnosis not present

## 2024-09-19 DIAGNOSIS — R112 Nausea with vomiting, unspecified: Secondary | ICD-10-CM | POA: Diagnosis not present

## 2024-09-19 DIAGNOSIS — R509 Fever, unspecified: Secondary | ICD-10-CM | POA: Diagnosis not present
